# Patient Record
Sex: Female | Born: 1995 | Race: Black or African American | Hispanic: No | Marital: Single | Smoking: Never smoker
Health system: Southern US, Community
[De-identification: ages and names within clinical notes are randomized; demographics above are authoritative.]

## PROBLEM LIST (undated history)

## (undated) DIAGNOSIS — L309 Dermatitis, unspecified: Secondary | ICD-10-CM

## (undated) HISTORY — DX: Dermatitis, unspecified: L30.9

---

## 2019-07-16 ENCOUNTER — Other Ambulatory Visit: Payer: Self-pay

## 2019-07-16 ENCOUNTER — Encounter: Payer: Self-pay | Admitting: Allergy and Immunology

## 2019-07-16 ENCOUNTER — Ambulatory Visit (INDEPENDENT_AMBULATORY_CARE_PROVIDER_SITE_OTHER): Payer: BC Managed Care – PPO | Admitting: Allergy and Immunology

## 2019-07-16 VITALS — BP 100/90 | HR 72 | Temp 98.4°F | Ht 63.1 in | Wt 174.1 lb

## 2019-07-16 DIAGNOSIS — H1013 Acute atopic conjunctivitis, bilateral: Secondary | ICD-10-CM | POA: Diagnosis not present

## 2019-07-16 DIAGNOSIS — T50905D Adverse effect of unspecified drugs, medicaments and biological substances, subsequent encounter: Secondary | ICD-10-CM

## 2019-07-16 DIAGNOSIS — H1010 Acute atopic conjunctivitis, unspecified eye: Secondary | ICD-10-CM | POA: Insufficient documentation

## 2019-07-16 DIAGNOSIS — L2089 Other atopic dermatitis: Secondary | ICD-10-CM

## 2019-07-16 DIAGNOSIS — T50905A Adverse effect of unspecified drugs, medicaments and biological substances, initial encounter: Secondary | ICD-10-CM | POA: Insufficient documentation

## 2019-07-16 DIAGNOSIS — J3089 Other allergic rhinitis: Secondary | ICD-10-CM | POA: Insufficient documentation

## 2019-07-16 MED ORDER — FLUTICASONE PROPIONATE 50 MCG/ACT NA SUSP: 2.0000 | Freq: Every day | NASAL | 5 refills | Status: AC | PRN

## 2019-07-16 MED ORDER — TACROLIMUS 0.03 % EX OINT: TOPICAL_OINTMENT | CUTANEOUS | 0 refills | Status: AC

## 2019-07-16 MED ORDER — LEVOCETIRIZINE DIHYDROCHLORIDE 5 MG PO TABS: 5.0000 mg | ORAL_TABLET | Freq: Every day | ORAL | 5 refills | Status: AC | PRN

## 2019-07-16 MED ORDER — CLOBETASOL PROPIONATE 0.05 % EX FOAM: Freq: Two times a day (BID) | CUTANEOUS | 5 refills | Status: AC | PRN

## 2019-07-16 MED ORDER — FLUOCINOLONE ACETONIDE BODY 0.01 % EX OIL: 1.0000 "application " | TOPICAL_OIL | Freq: Two times a day (BID) | CUTANEOUS | 5 refills | Status: AC | PRN

## 2019-07-16 NOTE — Progress Notes (Signed)
New Patient Note  RE: Grace Chung MRN: [id number] DOB: June 04, 1996 Date of Office Visit: 07/16/2019  Referring provider: No ref. provider found Primary care provider: Patient, No Pcp Per  Chief Complaint: Rash (itching, bad eszema flare, rash on face) and Allergic Rhinitis  (environmentals)   History of present illness: Grace Chung is a 23 y.o. female presenting today for evaluation of dermatitis and allergic rhinitis.  She is accompanied today by her mother who assists with the history.  She has had atopic dermatitis since infancy.  The eczema primarily involves her fingers, hands, feet, legs, neck, face, and scalp.  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 Under the care of a dermatologist, she was started on Dupixent injections approximately 4 years ago.  She self administers these injections every 2 weeks at home.  She reports that shortly after initiating the Dupixent injections she noticed some improvement in the eczema. However, she and her mother both believe that the eczema is worse now than it was before she started the injections..  She reports that she is "itching and scratching 24/7."  In addition, approximately 1-1/2 to 2 years ago she began to develop episodes of severe bilateral conjunctivitis.  The conjunctivitis "comes and goes" but has been persistent over the past 3 months.  She was seen by an ophthalmologist and started on Pazeo eyedrops without perceived benefit.  In addition to the dupilumab injections she also uses triamcinolone ointment as well as an unspecified cream.  She notes that when she was in high school she had started immunotherapy injections with significant improvement in her eczema and allergic rhinoconjunctivitis.  However, she discontinued the immunotherapy injections after 2 or 3 years when she moved away to college.  The symptoms have since returned. She experiences nasal congestion, rhinorrhea, sneezing, postnasal  drainage, nasal pruritus, and ocular pruritus.  These symptoms occur year-round but are more frequent and severe with pollen exposure.  When she was skin tested in the past she recalls having been positive to pollen, dust mite, and cat.  She had asthma as a child but has not had significant lower respiratory symptoms for many years.  Assessment and plan: Atopic dermatitis  Given the perceived lack of efficacy from [facility], as well as severe Dupixent associated conjunctivitis, we will discontinue this medication at this time.  A prescription has been provided for clobetasol 0.05% foam sparingly to affected areas twice daily as needed. This medication is not to be used on the face, neck, axillae, or groin. If this medication is used for 2 weeks straight, it should be held for 1 to 2 weeks before resuming therapy.  A prescription has been provided for Derma-Smoothe oil, sparingly to affected areas twice daily as needed. This medication may be used on the face and scalp.  I have recommended using Aquaphor to moisturize the skin.  Information has been provided regarding CLn BodyWash to reduce staph aureus colonization.  CLn BodyWash is ordered online however, if it is too expensive, information and instructions for diluted bleach baths have also been provided.  Adverse drug reaction Patient's history and physical examination suggest depicts an associated conjunctivitis.  Given the lack of perceived efficacy from [place], this medication will be discontinued at this time.  A prescription has been provided for Tacrolimus 0.03% eyedrop twice daily until symptoms have resolved.   I have also recommended warm compresses and artificial tears.  If the conjunctivitis persists or progresses despite this treatment plan further evaluation by ophthalmology  is warranted.  Other allergic rhinitis  Aeroallergen avoidance measures have been discussed and provided in written form.  A prescription has been  provided for levocetirizine(Xyzal), 5 mg daily as needed.  A prescription has been provided for fluticasone nasal spray, 2 sprays per nostril daily as needed.   Nasal saline spray (i.e. Simply Saline) is recommended prior to medicated nasal sprays and as needed.  The patient's old records have been requested and, based upon combination of today's testing with her previous skin test results, we will plan to initiate immunotherapy. Risks and benefits of immunotherapy have been discussed.  Allergic conjunctivitis  Treatment plan as outlined above for allergic rhinitis.  For now, continue Pazeo, one drop per eye daily if needed.  I have also recommended eye lubricant drops (i.e., Natural Tears) as needed.   Meds ordered this encounter  Medications  . clobetasol (OLUX) 0.05 % topical foam    Sig: Apply topically 2 (two) times daily as needed.    Dispense:  50 g    Refill:  5  . Fluocinolone Acetonide Body (DERMA-SMOOTHE/FS BODY) 0.01 % OIL    Sig: Apply 1 application topically 2 (two) times daily as needed (This medication may be used on the face and scalp.).    Dispense:  118.285 mL    Refill:  5  . levocetirizine (XYZAL) 5 MG tablet    Sig: Take 1 tablet (5 mg total) by mouth daily as needed for allergies.    Dispense:  30 tablet    Refill:  5  . fluticasone (FLONASE) 50 MCG/ACT nasal spray    Sig: Place 2 sprays into both nostrils daily as needed for allergies or rhinitis.    Dispense:  16 g    Refill:  5  . tacrolimus (PROTOPIC) 0.03 % ointment    Sig: 2 times daily to each eye until symptoms have resolved.    Dispense:  100 g    Refill:  0    Diagnostics: Environmental skin testing: Positive to cat. Food allergen skin testing: Negative despite a positive histamine control.    Physical examination: Blood pressure 100/90, pulse 72, temperature 98.4 F (36.9 C), temperature source Temporal, height 5' 3.1" (1.603 m), weight 174 lb 1.3 oz (79 kg), SpO2 99 %.  General:  Alert, interactive, in no acute distress. HEENT: TMs pearly gray, turbinates moderately edematous without discharge, post-pharynx mildly erythematous. Neck: Supple without lymphadenopathy. Lungs: Clear to auscultation without wheezing, rhonchi or rales. CV: Normal S1, S2 without murmurs. Abdomen: Nondistended, nontender. Skin: Dry, hyperpigmented, thickened patches on the fingers, hands, feet, ankles, calfs. Erythematous and dry patches over the upper lip and along the hairline of the forehead. Extremities:  No clubbing, cyanosis or edema. Neuro:   Grossly intact.  Review of systems:  Review of systems negative except as noted in HPI / PMHx or noted below: Review of Systems  Constitutional: Negative.   HENT: Negative.   Eyes: Negative.   Respiratory: Negative.   Cardiovascular: Negative.   Gastrointestinal: Negative.   Genitourinary: Negative.   Musculoskeletal: Negative.   Skin: Negative.   Neurological: Negative.   Endo/Heme/Allergies: Negative.   Psychiatric/Behavioral: Negative.     Past medical history:  Past Medical History:  Diagnosis Date  . Eczema     Past surgical history:  History reviewed. No pertinent surgical history.  Family history: Family History  Problem Relation Age of Onset  . Eczema Father   . Asthma Sister   . Eczema Sister   . Eczema  Brother   . Allergic rhinitis Maternal Uncle   . Asthma Paternal Uncle   . Urticaria Neg Hx     Social history: Social History   Socioeconomic History  . Marital status: Single    Spouse name: Not on file  . Number of children: Not on file  . Years of education: Not on file  . Highest education level: Not on file  Occupational History  . Not on file  Social Needs  . Financial resource strain: Not on file  . Food insecurity    Worry: Not on file    Inability: Not on file  . Transportation needs    Medical: Not on file    Non-medical: Not on file  Tobacco Use  . Smoking status: Never Smoker  .  Smokeless tobacco: Never Used  Substance and Sexual Activity  . Alcohol use: Not on file  . Drug use: Not on file  . Sexual activity: Not on file  Lifestyle  . Physical activity    Days per week: Not on file    Minutes per session: Not on file  . Stress: Not on file  Relationships  . Social Musicianconnections    Talks on phone: Not on file    Gets together: Not on file    Attends religious service: Not on file    Active member of club or organization: Not on file    Attends meetings of clubs or organizations: Not on file    Relationship status: Not on file  . Intimate partner violence    Fear of current or ex partner: Not on file    Emotionally abused: Not on file    Physically abused: Not on file    Forced sexual activity: Not on file  Other Topics Concern  . Not on file  Social History Narrative  . Not on file    Environmental History: Patient lives in a 21 year old house with carpeting throughout and central air/heat.  There is no known mold/water damage in the home.  There is a dog in the home which does not have access to her bedroom.  She is a non-smoker and is not exposed to secondhand cigarette smoke in the house or car.  Current Outpatient Medications  Medication Sig Dispense Refill  . dupilumab (DUPIXENT) 200 MG/1.14ML prefilled syringe Inject 300 mg into the skin once.    . Olopatadine HCl (PAZEO) 0.7 % SOLN Apply 1-2 drops to eye daily as needed.    . triamcinolone (KENALOG) 0.025 % cream Apply 1 application topically 2 (two) times daily.    Marland Kitchen. triamcinolone lotion (KENALOG) 0.1 % Apply 1 application topically 2 (two) times daily.    . clobetasol (OLUX) 0.05 % topical foam Apply topically 2 (two) times daily as needed. 50 g 5  . Fluocinolone Acetonide Body (DERMA-SMOOTHE/FS BODY) 0.01 % OIL Apply 1 application topically 2 (two) times daily as needed (This medication may be used on the face and scalp.). 118.285 mL 5  . fluticasone (FLONASE) 50 MCG/ACT nasal spray Place 2  sprays into both nostrils daily as needed for allergies or rhinitis. 16 g 5  . levocetirizine (XYZAL) 5 MG tablet Take 1 tablet (5 mg total) by mouth daily as needed for allergies. 30 tablet 5  . tacrolimus (PROTOPIC) 0.03 % ointment 2 times daily to each eye until symptoms have resolved. 100 g 0   No current facility-administered medications for this visit.     Known medication allergies: Not on File  I  appreciate the opportunity to take part in Sunny's care. Please do not hesitate to contact me with questions.  Sincerely,   R. Jorene Guest, MD

## 2019-07-16 NOTE — Assessment & Plan Note (Addendum)
Patient's history and physical examination suggest depicts an associated conjunctivitis.  Given the lack of perceived efficacy from [facility], this medication will be discontinued at this time.  A prescription has been provided for Tacrolimus 0.03% eyedrop twice daily until symptoms have resolved.   I have also recommended warm compresses and artificial tears.  If the conjunctivitis persists or progresses despite this treatment plan further evaluation by ophthalmology is warranted.

## 2019-07-16 NOTE — Assessment & Plan Note (Signed)
   Aeroallergen avoidance measures have been discussed and provided in written form.  A prescription has been provided for levocetirizine(Xyzal), 5 mg daily as needed.  A prescription has been provided for fluticasone nasal spray, 2 sprays per nostril daily as needed.   Nasal saline spray (i.e. Simply Saline) is recommended prior to medicated nasal sprays and as needed.  The patient's old records have been requested and, based upon combination of today's testing with her previous skin test results, we will plan to initiate immunotherapy. Risks and benefits of immunotherapy have been discussed.

## 2019-07-16 NOTE — Patient Instructions (Addendum)
Atopic dermatitis  Given the perceived lack of efficacy from [facility], as well as severe Dupixent associated conjunctivitis, we will discontinue this medication at this time.  A prescription has been provided for clobetasol 0.05% foam sparingly to affected areas twice daily as needed. This medication is not to be used on the face, neck, axillae, or groin. If this medication is used for 2 weeks straight, it should be held for 1 to 2 weeks before resuming therapy.  A prescription has been provided for Derma-Smoothe oil, sparingly to affected areas twice daily as needed. This medication may be used on the face and scalp.  I have recommended using Aquaphor to moisturize the skin.  Information has been provided regarding CLn BodyWash to reduce staph aureus colonization.  CLn BodyWash is ordered online however, if it is too expensive, information and instructions for diluted bleach baths have also been provided.  Adverse drug reaction Patient's history and physical examination suggest depicts an associated conjunctivitis.  Given the lack of perceived efficacy from [facility], this medication will be discontinued at this time.  A prescription has been provided for Tacrolimus 0.03% eyedrop twice daily until symptoms have resolved.   I have also recommended warm compresses and artificial tears.  If the conjunctivitis persists or progresses despite this treatment plan further evaluation by ophthalmology is warranted.  Other allergic rhinitis  Aeroallergen avoidance measures have been discussed and provided in written form.  A prescription has been provided for levocetirizine(Xyzal), 5 mg daily as needed.  A prescription has been provided for fluticasone nasal spray, 2 sprays per nostril daily as needed.   Nasal saline spray (i.e. Simply Saline) is recommended prior to medicated nasal sprays and as needed.  The patient's old records have been requested and, based upon combination of today's  testing with her previous skin test results, we will plan to initiate immunotherapy. Risks and benefits of immunotherapy have been discussed.  Allergic conjunctivitis  Treatment plan as outlined above for allergic rhinitis.  For now, continue Pazeo, one drop per eye daily if needed.  I have also recommended eye lubricant drops (i.e., Natural Tears) as needed.   Return in about 3 months (around 10/16/2019), or if symptoms worsen or fail to improve.   ECZEMA SKIN CARE REGIMEN:  Bathe and soak for 10 minutes in warm water once today. Pat dry.  Immediately apply the below emollients: To healthy skin apply Aquaphor or Vaseline jelly twice a day. Note of any foods make the eczema worse. Keep finger nails trimmed and filed.  CLn BodyWash may be ordered online at [email or web address]  If CLn BodyWash is too expensive, may try diluted bleach baths...  Diluted bleach bath recipe and instructions:   Add  -  cup of common household bleach to a bathtub full of water.  Soak the affected part of the body (below the head and neck) for about 10 minutes.  Limit diluted bleach baths to no more than twice a week.   Do not submerge the head or face and be very careful to avoid getting the diluted bleach into the eyes.   Rinse off with fresh water and apply moisturizer.

## 2019-07-16 NOTE — Assessment & Plan Note (Signed)
   Treatment plan as outlined above for allergic rhinitis.  For now, continue Pazeo, one drop per eye daily if needed.  I have also recommended eye lubricant drops (i.e., Natural Tears) as needed.

## 2019-07-16 NOTE — Assessment & Plan Note (Addendum)
   Given the perceived lack of efficacy from [facility], as well as severe Dupixent associated conjunctivitis, we will discontinue this medication at this time.  A prescription has been provided for clobetasol 0.05% foam sparingly to affected areas twice daily as needed. This medication is not to be used on the face, neck, axillae, or groin. If this medication is used for 2 weeks straight, it should be held for 1 to 2 weeks before resuming therapy.  A prescription has been provided for Derma-Smoothe oil, sparingly to affected areas twice daily as needed. This medication may be used on the face and scalp.  I have recommended using Aquaphor to moisturize the skin.  Information has been provided regarding CLn BodyWash to reduce staph aureus colonization.  CLn BodyWash is ordered online however, if it is too expensive, information and instructions for diluted bleach baths have also been provided.

## 2019-10-22 ENCOUNTER — Ambulatory Visit: Payer: BC Managed Care – PPO | Admitting: Allergy and Immunology

## 2019-12-09 ENCOUNTER — Other Ambulatory Visit: Payer: Self-pay

## 2019-12-09 ENCOUNTER — Encounter (HOSPITAL_COMMUNITY): Payer: Self-pay | Admitting: *Deleted

## 2019-12-09 ENCOUNTER — Emergency Department (HOSPITAL_COMMUNITY): Payer: BC Managed Care – PPO

## 2019-12-09 ENCOUNTER — Emergency Department (HOSPITAL_COMMUNITY)
Admission: EM | Admit: 2019-12-09 | Discharge: 2019-12-09 | Disposition: A | Discharge status: Home / Self Care | Payer: BC Managed Care – PPO | Attending: Emergency Medicine | Admitting: Emergency Medicine

## 2019-12-09 DIAGNOSIS — G43809 Other migraine, not intractable, without status migrainosus: Secondary | ICD-10-CM | POA: Diagnosis not present

## 2019-12-09 DIAGNOSIS — H53149 Visual discomfort, unspecified: Secondary | ICD-10-CM | POA: Insufficient documentation

## 2019-12-09 DIAGNOSIS — R1032 Left lower quadrant pain: Secondary | ICD-10-CM | POA: Insufficient documentation

## 2019-12-09 LAB — COMPREHENSIVE METABOLIC PANEL
ALT: 14 U/L (ref 0–44)
AST: 14 U/L — ABNORMAL LOW (ref 15–41)
Albumin: 4 g/dL (ref 3.5–5.0)
Alkaline Phosphatase: 62 U/L (ref 38–126)
Anion gap: 11 (ref 5–15)
BUN: 10 mg/dL (ref 6–20)
CO2: 22 mmol/L (ref 22–32)
Calcium: 9.6 mg/dL (ref 8.9–10.3)
Chloride: 105 mmol/L (ref 98–111)
Creatinine, Ser: 0.85 mg/dL (ref 0.44–1.00)
GFR calc Af Amer: >60 mL/min (ref >60)
GFR calc non Af Amer: >60 mL/min (ref >60)
Glucose, Bld: 99 mg/dL (ref 70–99)
Potassium: 3.8 mmol/L (ref 3.5–5.1)
Sodium: 138 mmol/L (ref 135–145)
Total Bilirubin: 0.7 mg/dL (ref 0.3–1.2)
Total Protein: 7.3 g/dL (ref 6.5–8.1)

## 2019-12-09 LAB — CBC WITH DIFFERENTIAL/PLATELET
Abs Immature Granulocytes: 0.02 10*3/uL (ref 0.00–0.07)
Basophils Absolute: 0.1 10*3/uL (ref 0.0–0.1)
Basophils Relative: 1 %
Eosinophils Absolute: 0.1 10*3/uL (ref 0.0–0.5)
Eosinophils Relative: 1 %
HCT: 44.3 % (ref 36.0–46.0)
Hemoglobin: 14.3 g/dL (ref 12.0–15.0)
Immature Granulocytes: 0 %
Lymphocytes Relative: 27 %
Lymphs Abs: 2.3 10*3/uL (ref 0.7–4.0)
MCH: 31.8 pg (ref 26.0–34.0)
MCHC: 32.3 g/dL (ref 30.0–36.0)
MCV: 98.4 fL (ref 80.0–100.0)
Monocytes Absolute: 0.6 10*3/uL (ref 0.1–1.0)
Monocytes Relative: 8 %
Neutro Abs: 5.3 10*3/uL (ref 1.7–7.7)
Neutrophils Relative %: 63 %
Platelets: 302 10*3/uL (ref 150–400)
RBC: 4.5 MIL/uL (ref 3.87–5.11)
RDW: 12.2 % (ref 11.5–15.5)
WBC: 8.3 10*3/uL (ref 4.0–10.5)
nRBC: 0 % (ref 0.0–0.2)

## 2019-12-09 LAB — I-STAT BETA HCG BLOOD, ED (MC, WL, AP ONLY): I-stat hCG, quantitative: <5 m[IU]/mL (ref <5)

## 2019-12-09 MED ORDER — METOCLOPRAMIDE HCL 5 MG/ML IJ SOLN
10.0000 mg | Freq: Once | INTRAMUSCULAR | Status: AC
  Administered 2019-12-09: 10 mg via INTRAVENOUS
  Filled 2019-12-09: qty 2

## 2019-12-09 MED ORDER — DIPHENHYDRAMINE HCL 50 MG/ML IJ SOLN
25.0000 mg | Freq: Once | INTRAMUSCULAR | Status: AC
  Administered 2019-12-09: 25 mg via INTRAVENOUS
  Filled 2019-12-09: qty 1

## 2019-12-09 MED ORDER — IOHEXOL 300 MG/ML  SOLN
100.0000 mL | Freq: Once | INTRAMUSCULAR | Status: AC | PRN
  Administered 2019-12-09: 13:00:00 100 mL via INTRAVENOUS
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
## 2019-12-17 ENCOUNTER — Ambulatory Visit: Payer: BC Managed Care – PPO | Admitting: Allergy and Immunology

## 2019-12-24 ENCOUNTER — Ambulatory Visit: Payer: BC Managed Care – PPO | Admitting: Allergy and Immunology

## 2020-01-07 ENCOUNTER — Ambulatory Visit: Payer: BC Managed Care – PPO | Admitting: Allergy and Immunology

## 2020-02-03 ENCOUNTER — Telehealth: Payer: BC Managed Care – PPO

## 2021-01-10 DIAGNOSIS — L509 Urticaria, unspecified: Secondary | ICD-10-CM

## 2021-01-10 DIAGNOSIS — T7840XA Allergy, unspecified, initial encounter: Secondary | ICD-10-CM

## 2021-01-10 DIAGNOSIS — J020 Streptococcal pharyngitis: Secondary | ICD-10-CM

## 2021-04-17 ENCOUNTER — Telehealth: Payer: BC Managed Care – PPO | Admitting: Nurse Practitioner

## 2021-04-17 DIAGNOSIS — F10939 Alcohol use, unspecified with withdrawal, unspecified: Secondary | ICD-10-CM

## 2021-04-17 DIAGNOSIS — F10239 Alcohol dependence with withdrawal, unspecified: Secondary | ICD-10-CM

## 2021-04-17 NOTE — Progress Notes (Signed)
Mckenlee,  We have attempted to call you and discuss your condition but were unable to reach you.   Alcohol and drug withdrawal requires close monitoring   Based on what you shared with me, I feel your condition warrants further evaluation as soon as possible at an Emergency department.    NOTE: There will be NO CHARGE for this eVisit   If you are having a true medical emergency please call 911.      Emergency Department-[facility]  Get Driving Directions  [phone]  [address]  [place], [place] [address]  Open 24/7/365      [facility] Emergency Department at [facility]  Get Driving Directions  [address]  [place], [place] [address]  Open 24/7/365    Emergency Department- [facility]  Get Driving Directions  [phone]  [address]  [place], [place] [address]  Open 24/7/365      [facility]  Get Driving Directions  [phone]  [address]  [place], [place] [address]  Open 24/7/365    [facility]  Emergency Department- [facility]  Get Driving Directions  [phone]  [address]  [place], [place] [address]  Open 24/7/365    [place]  Emergency Department- [facility]  Get Driving Directions  [address]  [place], [place] [address]  Open 24/7/365    [facility]  Emergency Department- [facility]  Get Driving Directions  [phone]  [address]  [place], [place] [address]  Open 24/7/365  I spent approximately 7 minutes reviewing the patient's history, current symptoms and coordinating their plan of care today.

## 2021-08-02 IMAGING — CT CT ABD-PELV W/ CM
2 of 4 series · 16 of 46 positions shown, 18 images · IV contrast (omnipaque)
Comparison: None.

CLINICAL DATA: Left lower quadrant and pelvic pain since last
evening.

EXAM:
CT ABDOMEN AND PELVIS WITH CONTRAST
TECHNIQUE: Multidetector CT imaging of the abdomen and pelvis was performed
using the standard protocol following bolus administration of
intravenous contrast.
CONTRAST:  100mL OMNIPAQUE IOHEXOL 300 MG/ML  SOLN

[Series 3: abdomen 5.0 · axial · 0.66mm/px · z∈[+724,+1124]mm · 13 of 92 slices shown, 15 images]
[im 6/92  soft-tissue]
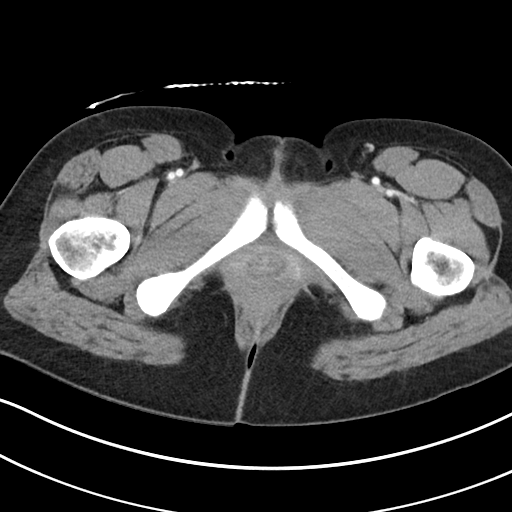
[im 6/92  bone]
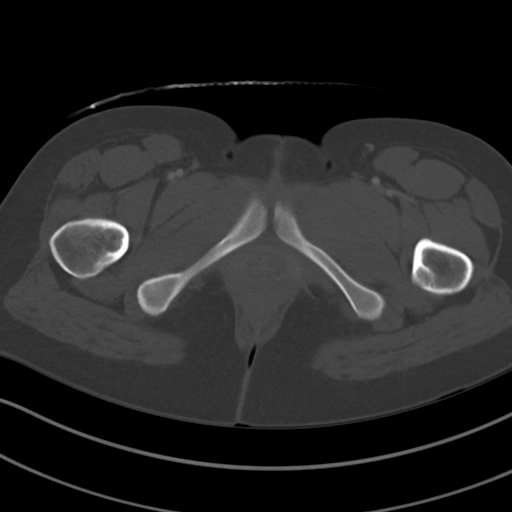
[im 11/92  soft-tissue]
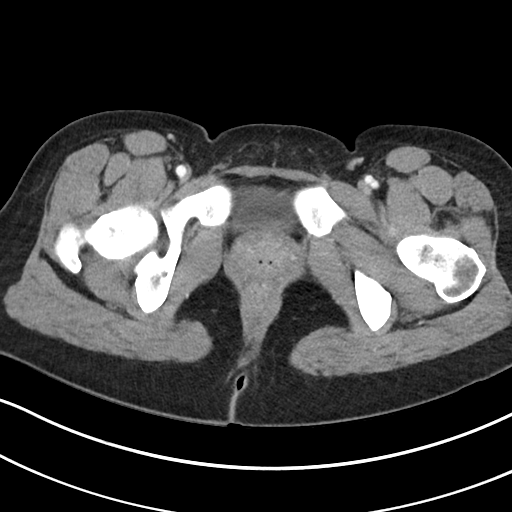
[im 22/92  soft-tissue]
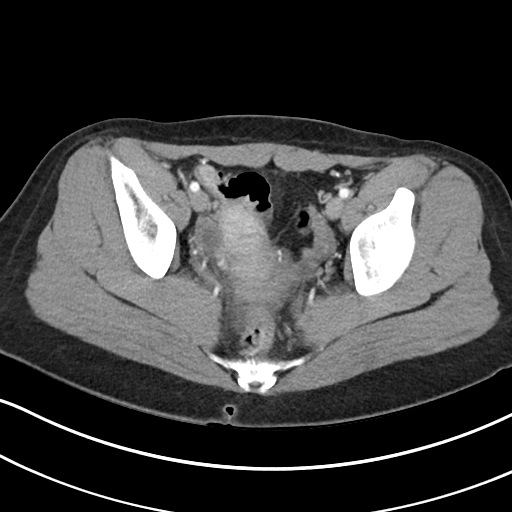
[im 27/92  soft-tissue]
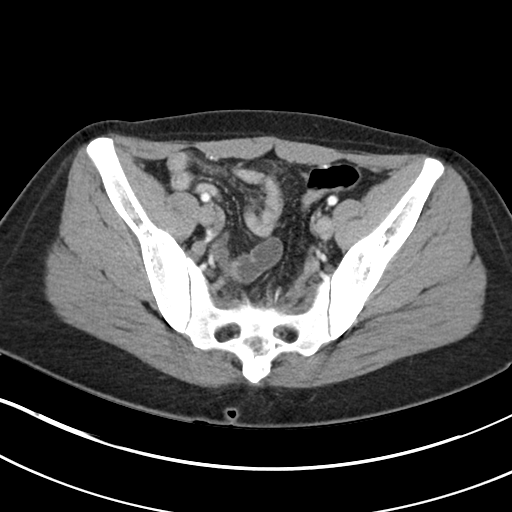
[im 33/92  soft-tissue]
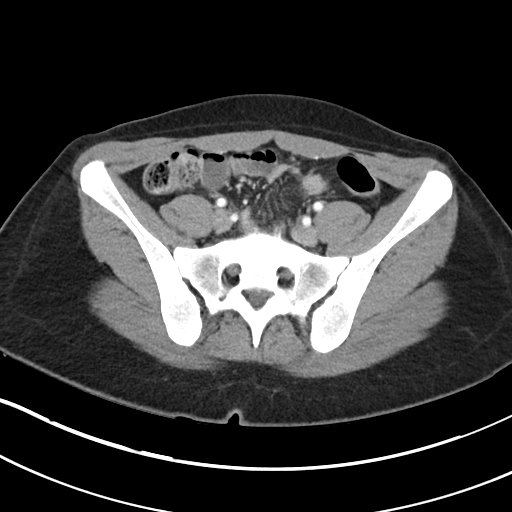
[im 38/92  soft-tissue]
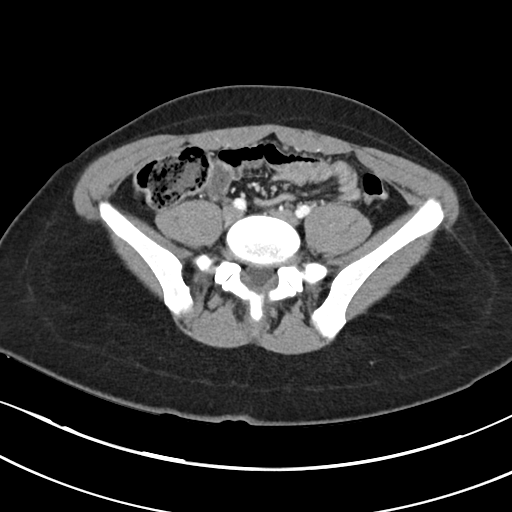
[im 49/92  soft-tissue]
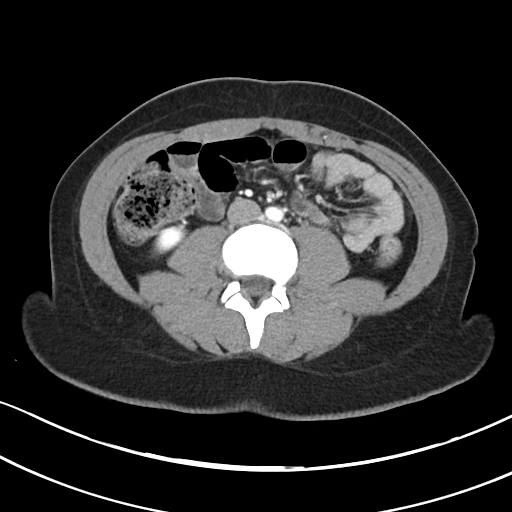
[im 54/92  soft-tissue]
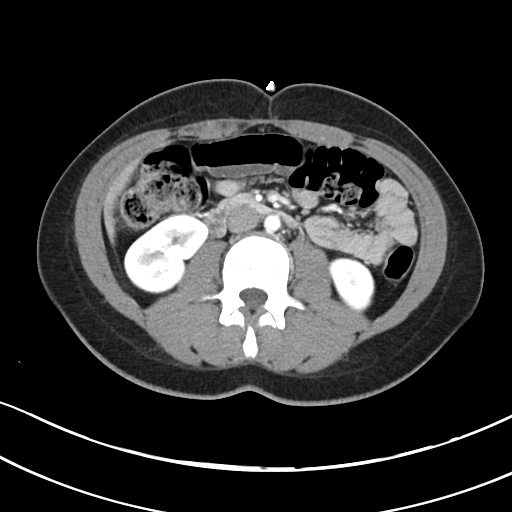
[im 59/92  soft-tissue]
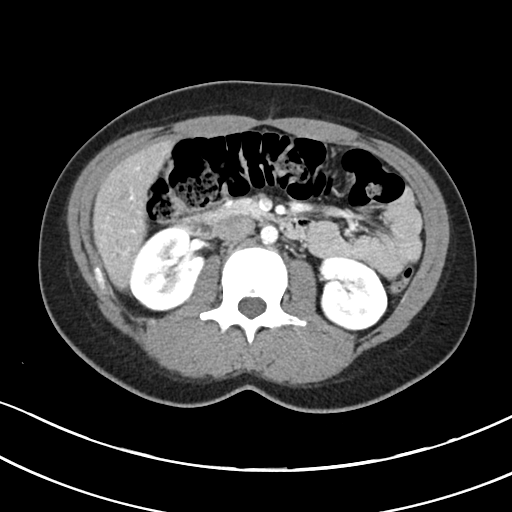
[im 59/92  bone]
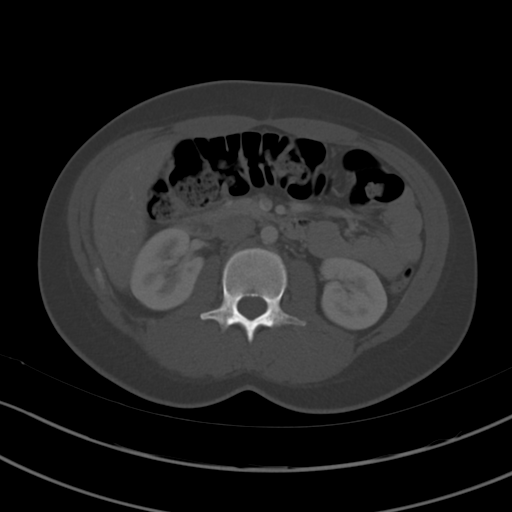
[im 65/92  soft-tissue]
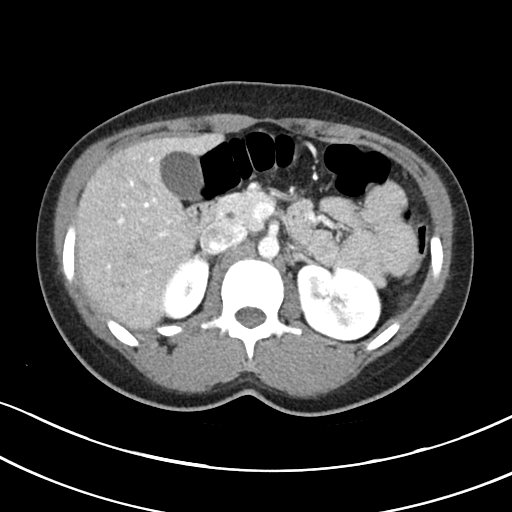
[im 70/92  soft-tissue]
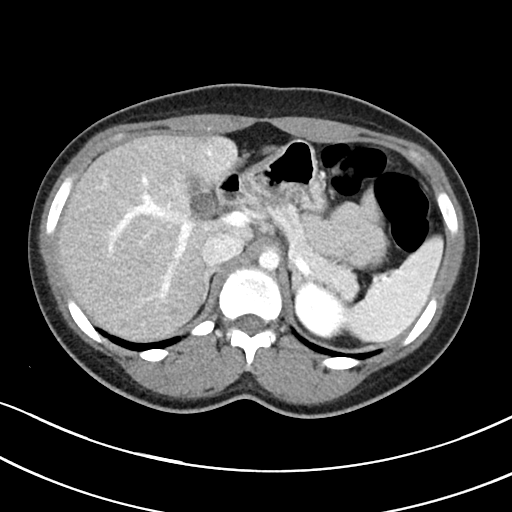
[im 81/92  soft-tissue]
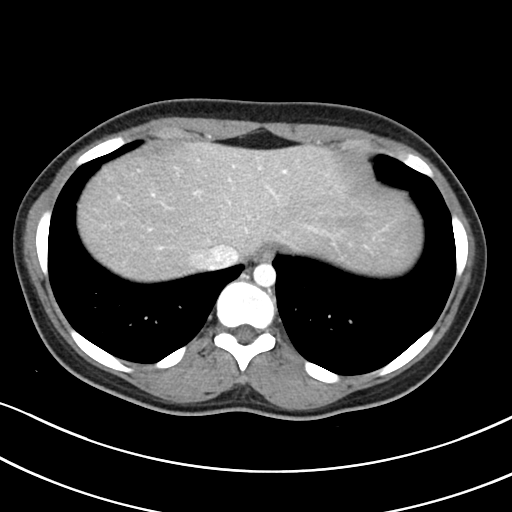
[im 86/92  soft-tissue]
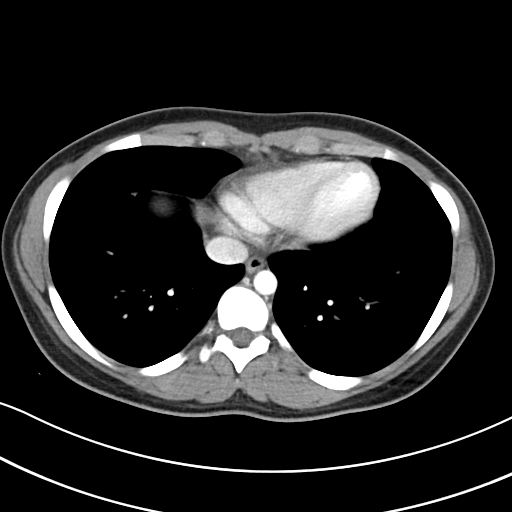

[Series 6: abdomen 3.0 mpr cor · coronal · 0.84mm/px · 3 of 64 slices shown]
[im 22/64  soft-tissue]
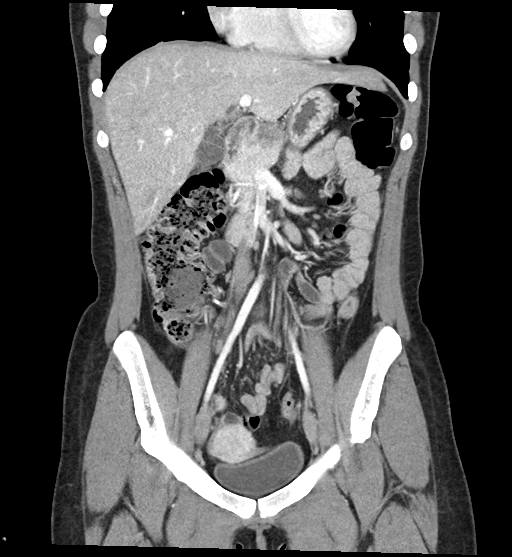
[im 29/64  soft-tissue]
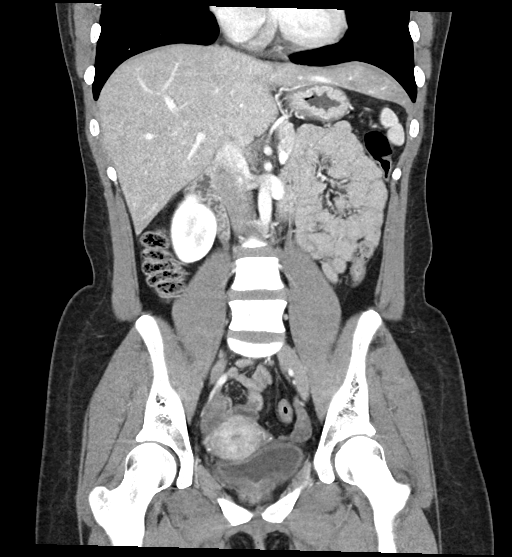
[im 36/64  soft-tissue]
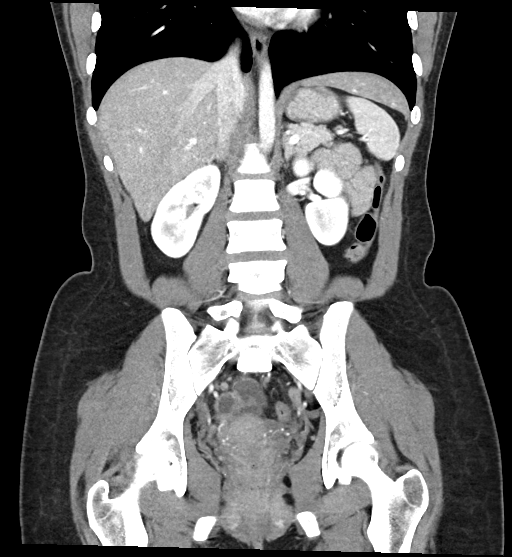

[16 of 46 positions shown; findings below may reference images not displayed]

FINDINGS: Lower chest: The lung bases are clear of acute process. No pleural
effusion or pulmonary lesions. The heart is normal in size. No
pericardial effusion. The distal esophagus and aorta are
unremarkable.

Hepatobiliary: No focal hepatic lesions or intrahepatic biliary
dilatation. The gallbladder is normal. No common bile duct
dilatation.

Pancreas: No mass, inflammation or ductal dilatation.

Spleen: Normal size. No focal lesions.

Adrenals/Urinary Tract: The adrenal glands and kidneys are
unremarkable. No renal, ureteral or bladder calculi or mass. No CT
findings to suggest pyelonephritis.

Stomach/Bowel: The stomach, duodenum, small bowel and colon are
unremarkable. No acute inflammatory changes, mass lesions or
obstructive findings. The terminal ileum appears normal. The
appendix is not identified for certain but I do not see any findings
suspicious for acute appendicitis.

Vascular/Lymphatic: The aorta is normal in caliber. No dissection.
The branch vessels are patent. The major venous structures are
patent. No mesenteric or retroperitoneal mass or adenopathy. Small
scattered lymph nodes are noted.

Reproductive: The uterus and ovaries are unremarkable.

Other: No pelvic mass or adenopathy. No free pelvic fluid
collections. No inguinal mass or adenopathy. No abdominal wall
hernia or subcutaneous lesions.

Musculoskeletal: No significant bony findings.
IMPRESSION: 1. No acute abdominal/pelvic findings, mass lesions or adenopathy.
2. The appendix is not identified for certain but I do not see any
findings suspicious for acute appendicitis.
3. No renal, ureteral or bladder calculi or mass.

## 2024-05-24 ENCOUNTER — Other Ambulatory Visit: Payer: Self-pay | Admitting: Medical Genetics

## 2024-10-16 ENCOUNTER — Other Ambulatory Visit: Payer: Self-pay | Admitting: Medical Genetics

## 2024-10-16 DIAGNOSIS — Z006 Encounter for examination for normal comparison and control in clinical research program: Secondary | ICD-10-CM
